# Patient Record
Sex: Male | Born: 1985 | Race: White | Hispanic: No | Smoking: Current every day smoker
Health system: Southern US, Community
[De-identification: ages and names within clinical notes are randomized; demographics above are authoritative.]

---

## 2019-10-17 ENCOUNTER — Encounter (HOSPITAL_COMMUNITY): Payer: Self-pay | Admitting: *Deleted

## 2019-10-17 ENCOUNTER — Emergency Department (HOSPITAL_COMMUNITY): Payer: Managed Care, Other (non HMO)

## 2019-10-17 ENCOUNTER — Emergency Department (HOSPITAL_COMMUNITY)
Admission: EM | Admit: 2019-10-17 | Discharge: 2019-10-17 | Disposition: A | Discharge status: Home / Self Care | Payer: Managed Care, Other (non HMO) | Attending: Emergency Medicine | Admitting: Emergency Medicine

## 2019-10-17 ENCOUNTER — Other Ambulatory Visit: Payer: Self-pay

## 2019-10-17 DIAGNOSIS — W228XXA Striking against or struck by other objects, initial encounter: Secondary | ICD-10-CM | POA: Insufficient documentation

## 2019-10-17 DIAGNOSIS — F17200 Nicotine dependence, unspecified, uncomplicated: Secondary | ICD-10-CM | POA: Diagnosis not present

## 2019-10-17 DIAGNOSIS — R110 Nausea: Secondary | ICD-10-CM | POA: Diagnosis not present

## 2019-10-17 DIAGNOSIS — Y9389 Activity, other specified: Secondary | ICD-10-CM | POA: Insufficient documentation

## 2019-10-17 DIAGNOSIS — R55 Syncope and collapse: Secondary | ICD-10-CM | POA: Diagnosis not present

## 2019-10-17 DIAGNOSIS — S0101XA Laceration without foreign body of scalp, initial encounter: Secondary | ICD-10-CM | POA: Diagnosis present

## 2019-10-17 DIAGNOSIS — Y92812 Truck as the place of occurrence of the external cause: Secondary | ICD-10-CM | POA: Diagnosis not present

## 2019-10-17 DIAGNOSIS — R519 Headache, unspecified: Secondary | ICD-10-CM | POA: Diagnosis not present

## 2019-10-17 DIAGNOSIS — Y999 Unspecified external cause status: Secondary | ICD-10-CM | POA: Insufficient documentation

## 2019-10-17 MED ORDER — LIDOCAINE-EPINEPHRINE-TETRACAINE (LET) TOPICAL GEL
3.0000 mL | Freq: Once | TOPICAL | Status: AC
  Administered 2019-10-17: 3 mL via TOPICAL
  Filled 2019-10-17: qty 3

## 2019-10-17 MED ORDER — ONDANSETRON 4 MG PO TBDP
4.0000 mg | ORAL_TABLET | Freq: Once | ORAL | Status: AC
  Administered 2019-10-17: 4 mg via ORAL
  Filled 2019-10-17: qty 1

## 2019-10-17 MED ORDER — ACETAMINOPHEN 325 MG PO TABS
650.0000 mg | ORAL_TABLET | Freq: Once | ORAL | Status: AC
  Administered 2019-10-17: 650 mg via ORAL
  Filled 2019-10-17: qty 2

## 2019-10-17 NOTE — ED Notes (Signed)
Pt transported to CT ?

## 2019-10-17 NOTE — Discharge Instructions (Addendum)
Your CT head and CT cervical spine was normal.  I have placed 2 staples to the back of your head, you will need to have these removed within 7-10 days. Please keep this area clean and dry.   If you experience any worsening symptoms, weakness please return to the ED.

## 2019-10-17 NOTE — ED Notes (Signed)
[phone] WIFE

## 2019-10-17 NOTE — ED Triage Notes (Signed)
The pt arrived by [facility] from home  The pt was working on his car stood ud up and struck the back of his head causing a laceration  Small cut no bleeding  He went to urgent care  And while standing in line he felt fain and fainted  On arrival here alert oriented skin warm and dry  C/o pain in the laceration site in the back of his head.  No nausea at present  A and o x4

## 2019-10-17 NOTE — ED Provider Notes (Signed)
[facility] EMERGENCY DEPARTMENT Provider Note   CSN: [id number] Arrival date & time: 10/17/19  1554     History Chief Complaint  Patient presents with  . Laceration    Douglas Mendoza is a 34 y.o. male.  34 y.o male with no PMH presents to the ED sent in from UC presents to the ED with a chief complaint of headache. Patient was working on his truck, reports striking his left back of his head. He reports he did not lose consciousness. While standing in line for UC check in, patient reports he felt nauseated and then proceeded to syncope. He reports falling from standing, losing consciousness. He reports waking up with everyone standing around him. Patient reports he feels like "a little run down" along with a headache most of the pain is locates on the right side of the back of his head. No vomiting episodes, no chest pain, no prior history of syncope.   The history is provided by the patient.  Laceration Associated symptoms: no fever        History reviewed. No pertinent past medical history.  There are no problems to display for this patient.   History reviewed. No pertinent surgical history.     No family history on file.  Social History   Tobacco Use  . Smoking status: Current Every Day Smoker  . Smokeless tobacco: Never Used  Substance Use Topics  . Alcohol use: Yes  . Drug use: Not on file    Home Medications Prior to Admission medications   Not on File    Allergies    Patient has no known allergies.  Review of Systems   Review of Systems  Constitutional: Negative for fever.  HENT: Negative for sore throat.   Eyes: Negative for photophobia and redness.  Respiratory: Negative for shortness of breath.   Cardiovascular: Negative for chest pain.  Gastrointestinal: Positive for nausea. Negative for abdominal pain and vomiting.  Genitourinary: Negative for flank pain.  Musculoskeletal: Negative for back pain.  Skin: Negative for pallor  and wound.  Neurological: Positive for light-headedness and headaches.    Physical Exam Updated Vital Signs BP 135/78 (BP Location: Left Arm)   Pulse 70   Temp 98.4 F (36.9 C)   Resp 16   Ht 5\' 9"  (1.753 m)   Wt 77.1 kg   SpO2 100%   BMI 25.10 kg/m   Physical Exam Vitals and nursing note reviewed.  Constitutional:      Appearance: He is well-developed.  HENT:     Head: Normocephalic and atraumatic.  Eyes:     General: No scleral icterus.    Pupils: Pupils are equal, round, and reactive to light.  Neck:     Comments: C collar in place.  Cardiovascular:     Heart sounds: Normal heart sounds.  Pulmonary:     Effort: Pulmonary effort is normal.     Breath sounds: Normal breath sounds. No wheezing.  Chest:     Chest wall: No tenderness.  Abdominal:     General: Bowel sounds are normal. There is no distension.     Palpations: Abdomen is soft.     Tenderness: There is no abdominal tenderness.  Musculoskeletal:        General: No tenderness or deformity.     Cervical back: Normal range of motion.  Skin:    General: Skin is warm and dry.  Neurological:     Mental Status: He is alert and oriented to  person, place, and time.     Comments: Alert, oriented, thought content appropriate. Speech fluent without evidence of aphasia. Able to follow 2 step commands without difficulty.  Cranial Nerves:  II:  Peripheral visual fields grossly normal, pupils, round, reactive to light III,IV, VI: ptosis not present, extra-ocular motions intact bilaterally  V,VII: smile symmetric, facial light touch sensation equal VIII: hearing grossly normal bilaterally  IX,X: midline uvula rise  XI: bilateral shoulder shrug equal and strong XII: midline tongue extension  Motor:  5/5 in upper and lower extremities bilaterally including strong and equal grip strength and dorsiflexion/plantar flexion Sensory: light touch normal in all extremities.  Cerebellar: normal finger-to-nose with bilateral  upper extremities, pronator drift negative Gait: normal gait and balance      ED Results / Procedures / Treatments   Labs (all labs ordered are listed, but only abnormal results are displayed) Labs Reviewed - No data to display  EKG None  Radiology CT Head Wo Contrast  Result Date: 10/17/2019 CLINICAL DATA:  Status post trauma. EXAM: CT HEAD WITHOUT CONTRAST TECHNIQUE: Contiguous axial images were obtained from the base of the skull through the vertex without intravenous contrast. COMPARISON:  None. FINDINGS: Brain: No evidence of acute infarction, hemorrhage, hydrocephalus, extra-axial collection or mass lesion/mass effect. Vascular: No hyperdense vessel or unexpected calcification. Skull: Normal. Negative for fracture or focal lesion. Sinuses/Orbits: No acute finding. Other: Mild right occipital scalp soft tissue swelling is seen. IMPRESSION: 1. No acute intracranial process. 2. Mild right occipital scalp soft tissue swelling. Electronically Signed   By: Aram Candela M.D.   On: 10/17/2019 16:52   CT Cervical Spine Wo Contrast  Result Date: 10/17/2019 CLINICAL DATA:  Status post trauma. EXAM: CT CERVICAL SPINE WITHOUT CONTRAST TECHNIQUE: Multidetector CT imaging of the cervical spine was performed without intravenous contrast. Multiplanar CT image reconstructions were also generated. COMPARISON:  None. FINDINGS: Alignment: Normal. Skull base and vertebrae: No acute fracture. No primary bone lesion or focal pathologic process. Soft tissues and spinal canal: No prevertebral fluid or swelling. No visible canal hematoma. Disc levels: C2-3: Normal endplates. Normal disc height and morphology. Normal bilateral uncovertebral and apophyseal joints. Normal central canal and intervertebral neuroforamina. C3-4: Normal endplates. Normal disc height and morphology. Normal bilateral uncovertebral and apophyseal joints. Normal central canal and intervertebral neuroforamina. C4-5: Normal endplates. Normal  disc height and morphology. Normal bilateral uncovertebral and apophyseal joints. Normal central canal and intervertebral neuroforamina. C5-6: Normal endplates. Normal disc height and morphology. Normal bilateral uncovertebral and apophyseal joints. Normal central canal and intervertebral neuroforamina. C6-7: Normal endplates. Normal disc height and morphology. Normal bilateral uncovertebral and apophyseal joints. Normal central canal and intervertebral neuroforamina. C7-T1: Normal endplates. Normal disc height and morphology. Normal bilateral uncovertebral and apophyseal joints. Normal central canal and intervertebral neuroforamina. Upper chest: Negative. Other: None. IMPRESSION: 1. Normal cervical spine CT. Electronically Signed   By: Aram Candela M.D.   On: 10/17/2019 16:54    Procedures .Marland KitchenLaceration Repair  Date/Time: 10/17/2019 5:55 PM Performed by: Claude Manges, PA-C Authorized by: Claude Manges, PA-C   Consent:    Consent obtained:  Verbal   Consent given by:  Patient   Risks discussed:  Infection, pain and poor cosmetic result   Alternatives discussed:  No treatment Anesthesia (see MAR for exact dosages):    Anesthesia method:  None Laceration details:    Location:  Scalp   Scalp location:  Crown   Length (cm):  1   Depth (mm):  0.5 Repair type:  Repair type:  Simple Exploration:    Hemostasis achieved with:  Direct pressure and LET Treatment:    Area cleansed with:  Saline   Amount of cleaning:  Extensive Skin repair:    Repair method:  Staples   Number of staples:  2 Approximation:    Approximation:  Close Post-procedure details:    Dressing:  Open (no dressing)   Patient tolerance of procedure:  Tolerated well, no immediate complications   (including critical care time)  Medications Ordered in ED Medications  acetaminophen (TYLENOL) tablet 650 mg (650 mg Oral Given 10/17/19 1633)  lidocaine-EPINEPHrine-tetracaine (LET) topical gel (3 mLs Topical Given 10/17/19  1704)  ondansetron (ZOFRAN-ODT) disintegrating tablet 4 mg (4 mg Oral Given 10/17/19 1703)    ED Course  I have reviewed the triage vital signs and the nursing notes.  Pertinent labs & imaging results that were available during my care of the patient were reviewed by me and considered in my medical decision making (see chart for details).    MDM Rules/Calculators/A&P   Patient with no PMH presents to the ED s/p syncopal episode while at [facility]. Patient was getting checked pain after bumping his head on his car, states he hit the back of his head and has a small 1.5 cm superficial laceration.  He reports pain to the area, while standing at urgent care he fell from standing striking the back of his head, he was placed in a c-collar and sent them to the ED for further evaluation.  During my evaluation patient is neurologically intact, he has been ambulatory in the ED with a steady gait.  He does report feeling nauseous, reports he feels he is somewhat in a fog.  Vitals are within normal limits, he is able to answer questions, is alert and oriented x4.  Will obtain CT imaging as patient does endorse some nausea along with loss of consciousness.  CT head showed no acute infarct, hemorrhage, pathology. CT cervical spine without any fractures, acute process.  She was given Tylenol 650 mg prior to CT imaging.  Patient is endorsing some nausea therefore given some Zofran.  We have applied let on to patient's head, this would be to help with anesthetic prior to repair.  I have personally placed 2 staples to the back of patient's head.  He was able to tolerate the procedure well.  Patient discharged in stable condition.   Portions of this note were generated with [employer]. Dictation errors may occur despite best attempts at proofreading.  Final Clinical Impression(s) / ED Diagnoses Final diagnoses:  Laceration of scalp without foreign body, initial encounter  Syncope, unspecified  syncope type    Rx / DC Orders ED Discharge Orders    None       Janeece Fitting, Hershal Coria 10/17/19 1756    Noemi Chapel, MD 10/18/19 [phone]

## 2019-10-17 NOTE — ED Notes (Signed)
Pt d/c home per MD order , Discharge summary reviewed, pt verbalizes understanding. Reports wife is discharge ride home. Ambulatory off unit. No s/s of acute distress noted.

## 2019-10-24 ENCOUNTER — Other Ambulatory Visit: Payer: Self-pay

## 2019-10-24 ENCOUNTER — Emergency Department (HOSPITAL_COMMUNITY)
Admission: EM | Admit: 2019-10-24 | Discharge: 2019-10-24 | Disposition: A | Discharge status: Home / Self Care | Payer: Managed Care, Other (non HMO) | Attending: Emergency Medicine | Admitting: Emergency Medicine

## 2019-10-24 DIAGNOSIS — X58XXXD Exposure to other specified factors, subsequent encounter: Secondary | ICD-10-CM | POA: Insufficient documentation

## 2019-10-24 DIAGNOSIS — F17200 Nicotine dependence, unspecified, uncomplicated: Secondary | ICD-10-CM | POA: Insufficient documentation

## 2019-10-24 DIAGNOSIS — S0101XD Laceration without foreign body of scalp, subsequent encounter: Secondary | ICD-10-CM | POA: Diagnosis not present

## 2019-10-24 DIAGNOSIS — Z4802 Encounter for removal of sutures: Secondary | ICD-10-CM | POA: Diagnosis present

## 2019-10-24 NOTE — Discharge Instructions (Signed)
Return to the ER with any new, worsening, or concerning symptoms.  

## 2019-10-24 NOTE — ED Triage Notes (Signed)
Pt reports he is hear for staple removal from scalp, staples placed on 1/30, pt denies any wound issues.

## 2019-10-24 NOTE — ED Notes (Signed)
Patient verbalizes understanding of discharge instructions . Opportunity for questions and answers were provided . Armband removed by staff ,Pt discharged from ED. W/C  offered at D/C  and Declined W/C at D/C and was escorted to lobby by RN.  

## 2019-10-24 NOTE — ED Provider Notes (Signed)
[place] [facility] EMERGENCY DEPARTMENT Provider Note   CSN: [id number] Arrival date & time: 10/24/19  1444     History Chief Complaint  Patient presents with  . Suture / Staple Removal    Douglas Mendoza is a 34 y.o. male presenting for staple removal.  Patient states 1 week ago he had 2 staples placed in the back of his head.  Reports no issues; no fevers, chills, tenderness, or purulent drainage.   HPI     No past medical history on file.  There are no problems to display for this patient.   No past surgical history on file.     No family history on file.  Social History   Tobacco Use  . Smoking status: Current Every Day Smoker  . Smokeless tobacco: Never Used  Substance Use Topics  . Alcohol use: Yes  . Drug use: Not on file    Home Medications Prior to Admission medications   Not on File    Allergies    Patient has no known allergies.  Review of Systems   Review of Systems  Skin: Positive for wound.    Physical Exam Updated Vital Signs BP 130/61   Pulse 68   Temp 98.4 F (36.9 C) (Oral)   Resp 12   SpO2 97%   Physical Exam Vitals and nursing note reviewed.  Constitutional:      General: He is not in acute distress.    Appearance: He is well-developed.  HENT:     Head: Normocephalic.      Comments: Well-healing laceration to the occiput.  Staples present.  No tenderness, fluctuance, induration, or purulent drainage. Pulmonary:     Effort: Pulmonary effort is normal.  Abdominal:     General: There is no distension.  Musculoskeletal:        General: Normal range of motion.     Cervical back: Normal range of motion.  Skin:    General: Skin is warm.     Findings: No rash.  Neurological:     Mental Status: He is alert and oriented to person, place, and time.     ED Results / Procedures / Treatments   Labs (all labs ordered are listed, but only abnormal results are displayed) Labs Reviewed - No data to  display  EKG None  Radiology No results found.  Procedures .Suture Removal  Date/Time: 10/24/2019 3:49 PM Performed by: Franchot Heidelberg, PA-C Authorized by: Franchot Heidelberg, PA-C   Consent:    Consent obtained:  Verbal   Consent given by:  Patient   Risks discussed:  Pain, wound separation and bleeding Location:    Location:  Head/neck   Head/neck location:  Scalp Procedure details:    Wound appearance:  No signs of infection, good wound healing, clean and nontender   Number of staples removed:  2 Post-procedure details:    Post-removal:  No dressing applied   Patient tolerance of procedure:  Tolerated well, no immediate complications   (including critical care time)  Medications Ordered in ED Medications - No data to display  ED Course  I have reviewed the triage vital signs and the nursing notes.  Pertinent labs & imaging results that were available during my care of the patient were reviewed by me and considered in my medical decision making (see chart for details).    MDM Rules/Calculators/A&P                      Patient  presenting for staple removal.  Physical exam reassuring, no signs of infection.  Staples removed as described above.  Aftercare instructions given.  At this time, patient appears safe for discharge.  Return precautions given.  Patient states he understands and agrees to plan.  Final Clinical Impression(s) / ED Diagnoses Final diagnoses:  Encounter for staple removal    Rx / DC Orders ED Discharge Orders    None       Alveria Apley, PA-C 10/24/19 1550    Derwood Kaplan, MD 10/24/19 1551

## 2020-11-11 IMAGING — CT CT CERVICAL SPINE W/O CM
3 of 5 series · 11 of 33 positions shown, 13 images · non-contrast
Comparison: None.

CLINICAL DATA: Status post trauma.

EXAM:
CT CERVICAL SPINE WITHOUT CONTRAST
TECHNIQUE: Multidetector CT imaging of the cervical spine was performed without
intravenous contrast. Multiplanar CT image reconstructions were also
generated.

[Series 6: c_spine 2.0 sag bone · sagittal · 0.22mm/px · 5 of 61 slices shown, 6 images]
[im 21/61  bone]
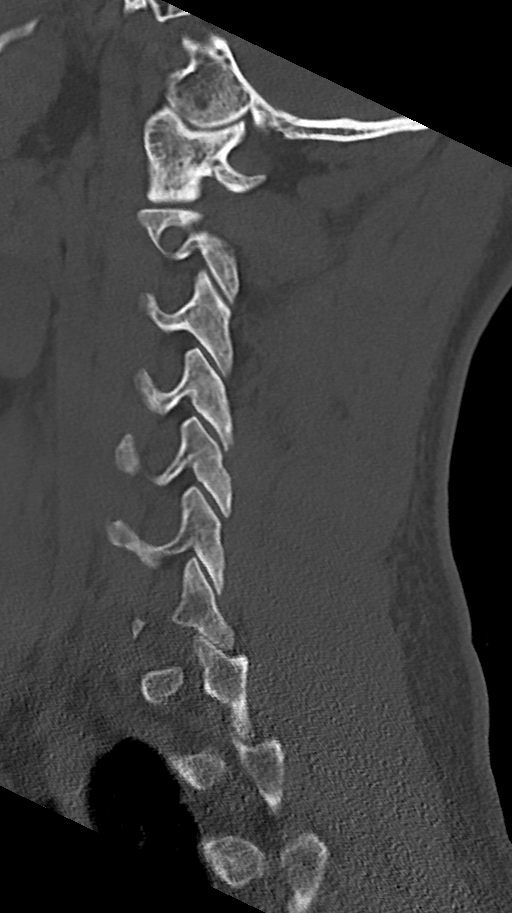
[im 26/61  bone]
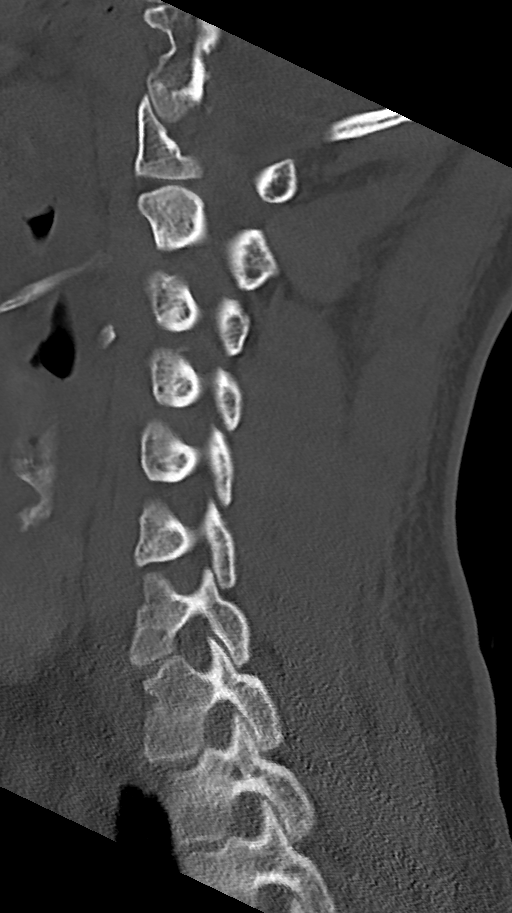
[im 31/61  soft-tissue]
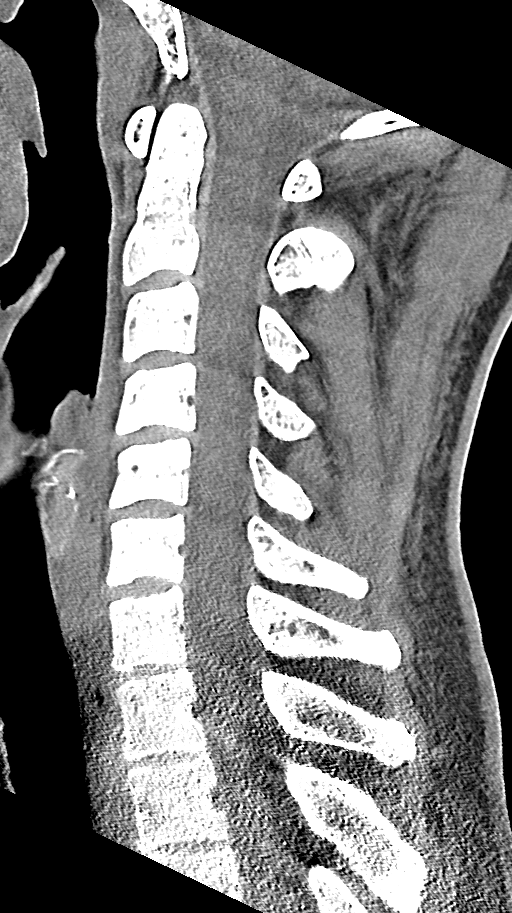
[im 31/61  bone]
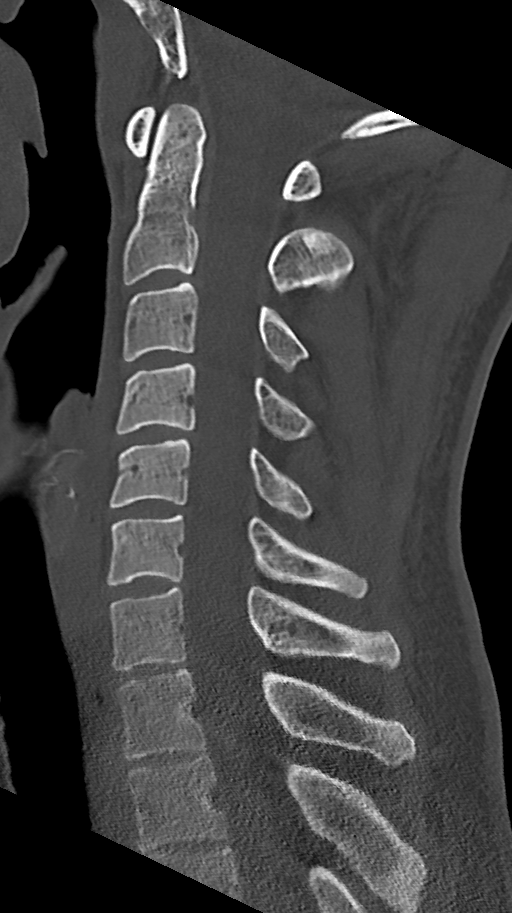
[im 36/61  bone]
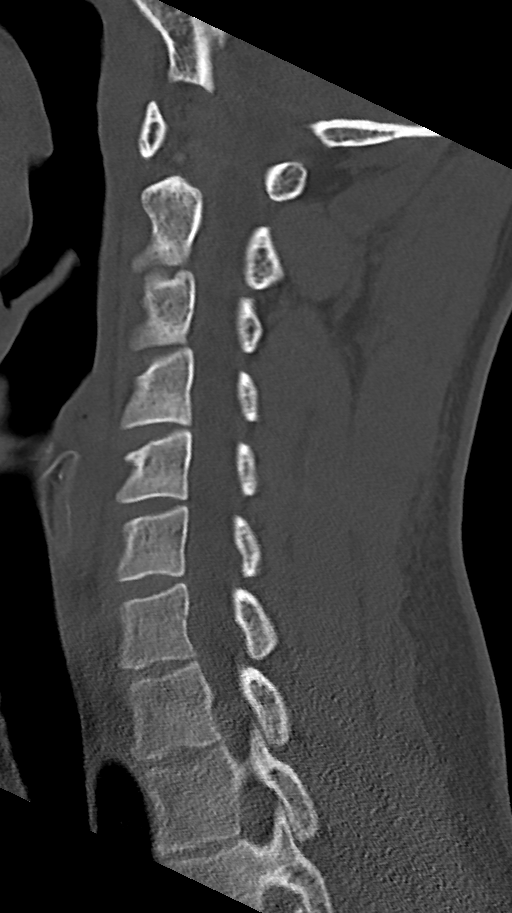
[im 41/61  bone]
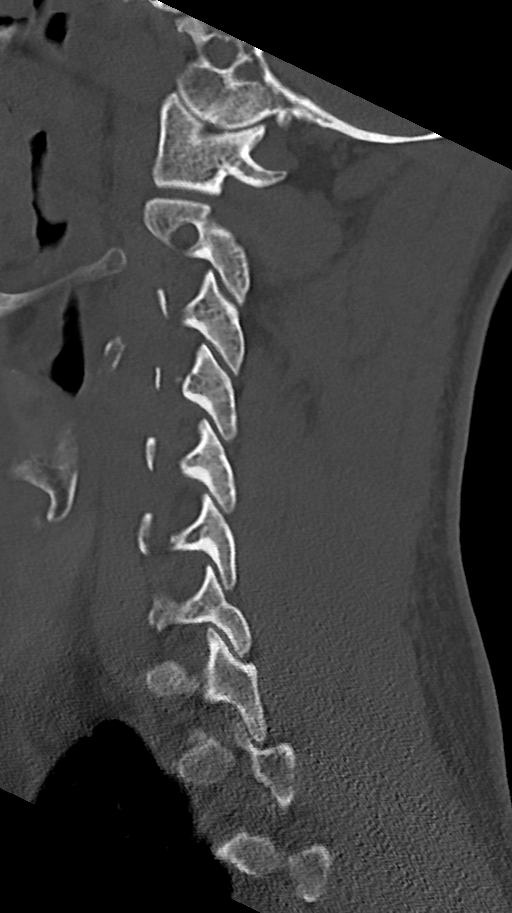

[Series 7: c_spine 2.0 cor bone · coronal · 0.25mm/px · 3 of 61 slices shown]
[im 13/61  bone]
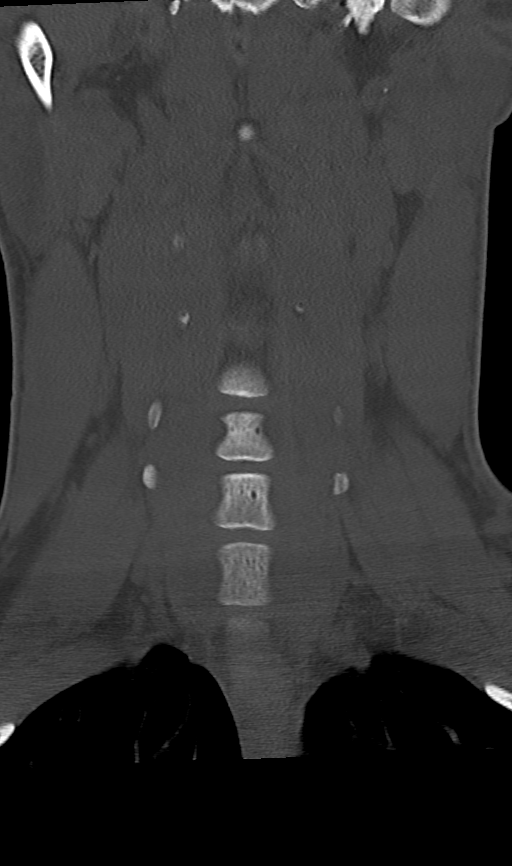
[im 25/61  bone]
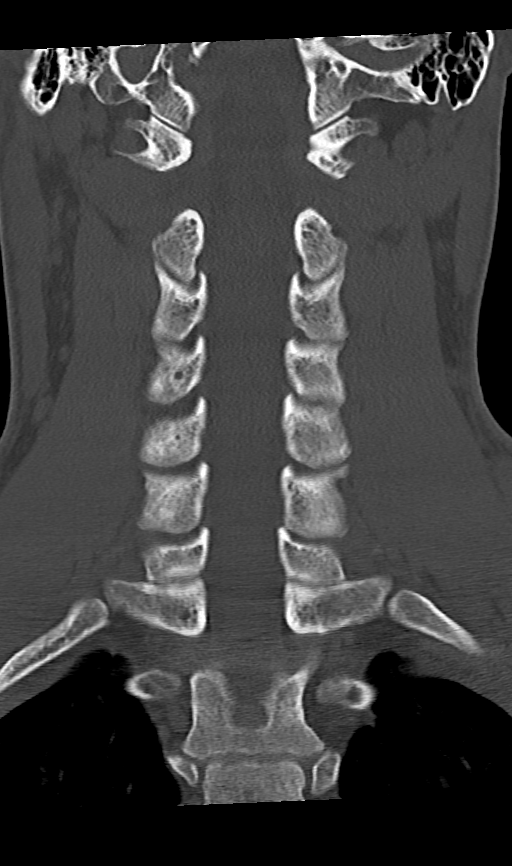
[im 36/61  bone]
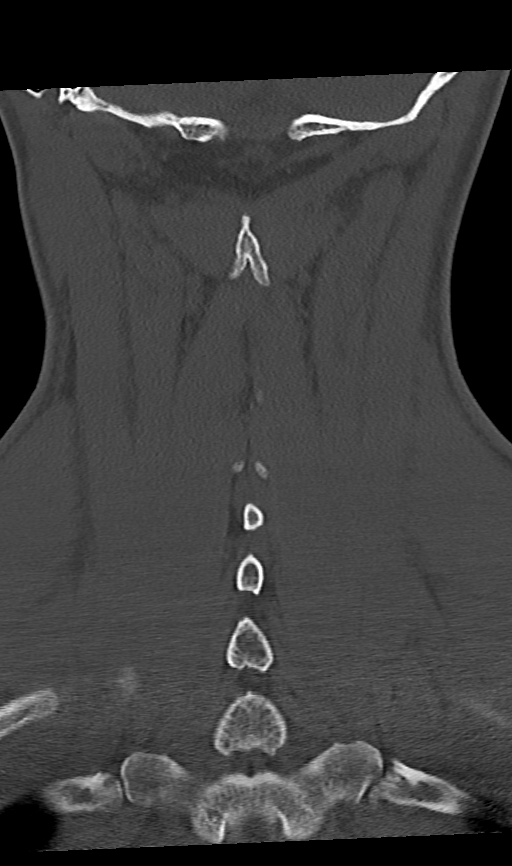

[Series 9: c_spine 1.0 st thins · axial · 0.36mm/px · z∈[-246,-148]mm · 3 of 248 slices shown, 4 images]
[im 71/248  soft-tissue]
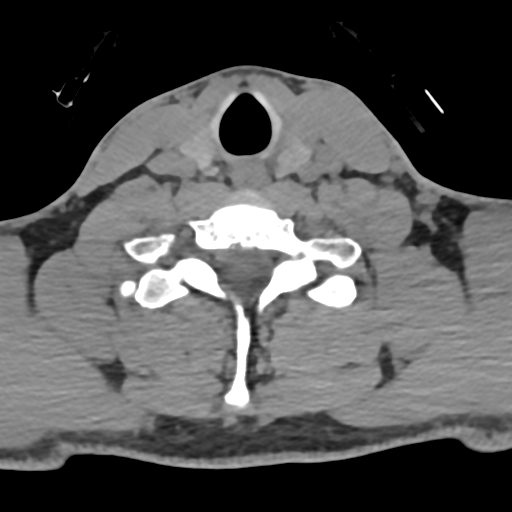
[im 71/248  bone]
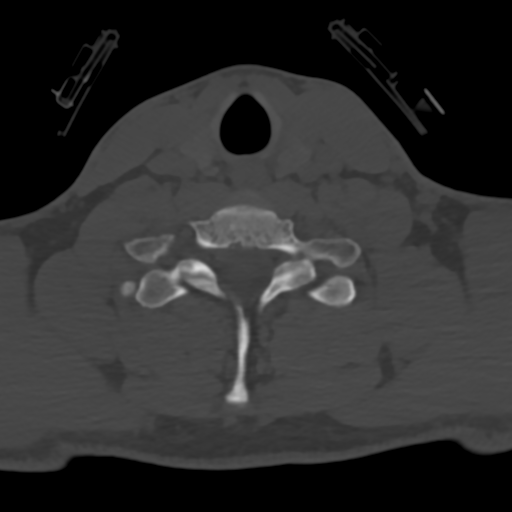
[im 142/248  bone]
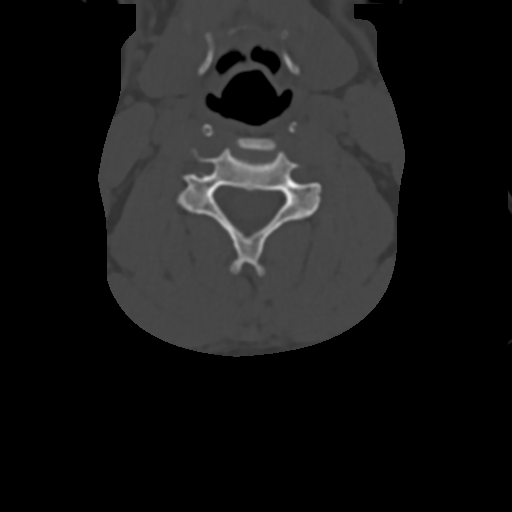
[im 212/248  bone]
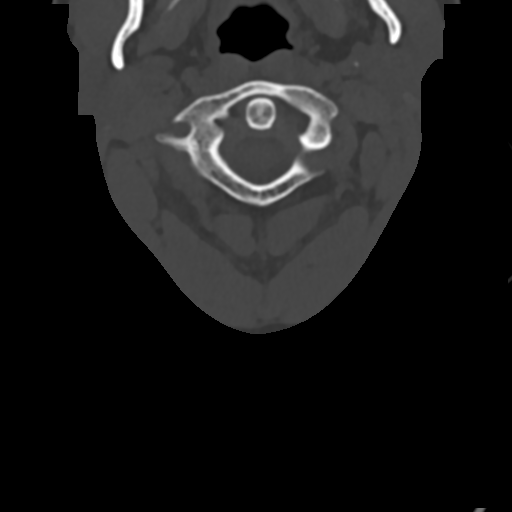

[11 of 33 positions shown; findings below may reference images not displayed]

FINDINGS: Alignment: Normal.

Skull base and vertebrae: No acute fracture. No primary bone lesion
or focal pathologic process.

Soft tissues and spinal canal: No prevertebral fluid or swelling. No
visible canal hematoma.

Disc levels:

C2-3: Normal endplates. Normal disc height and morphology. Normal
bilateral uncovertebral and apophyseal joints. Normal central canal
and intervertebral neuroforamina.
C3-4: Normal endplates. Normal disc height and morphology. Normal
bilateral uncovertebral and apophyseal joints. Normal central canal
and intervertebral neuroforamina.
C4-5: Normal endplates. Normal disc height and morphology. Normal
bilateral uncovertebral and apophyseal joints. Normal central canal
and intervertebral neuroforamina.
C5-6: Normal endplates. Normal disc height and morphology. Normal
bilateral uncovertebral and apophyseal joints. Normal central canal
and intervertebral neuroforamina.
C6-7: Normal endplates. Normal disc height and morphology. Normal
bilateral uncovertebral and apophyseal joints. Normal central canal
and intervertebral neuroforamina.
C7-T1: Normal endplates. Normal disc height and morphology. Normal
bilateral uncovertebral and apophyseal joints. Normal central canal
and intervertebral neuroforamina.

Upper chest: Negative.

Other: None.
IMPRESSION: 1. Normal cervical spine CT.

## 2020-11-11 IMAGING — CT CT HEAD W/O CM
4 series · 17 of 47 positions shown, 19 images · non-contrast
Comparison: None.

CLINICAL DATA: Status post trauma.

EXAM:
CT HEAD WITHOUT CONTRAST
TECHNIQUE: Contiguous axial images were obtained from the base of the skull
through the vertex without intravenous contrast.

[Series 3: head without · axial · non-contrast · 0.45mm/px · z∈[-107,+13]mm · 7 of 32 slices shown, 9 images]
[im 4/32  brain]
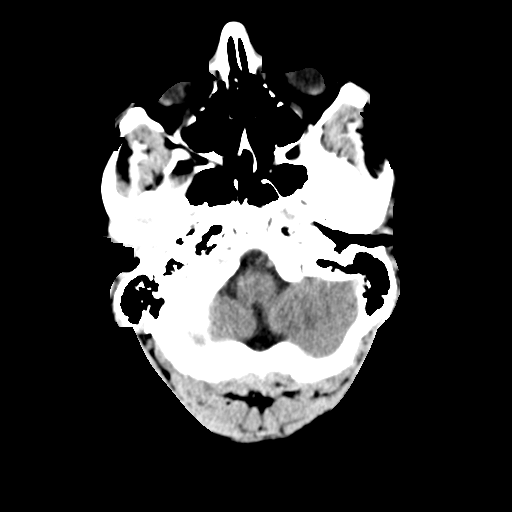
[im 4/32  bone]
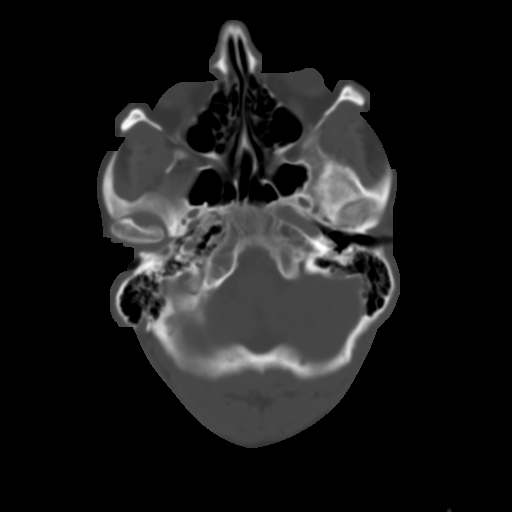
[im 8/32  brain]
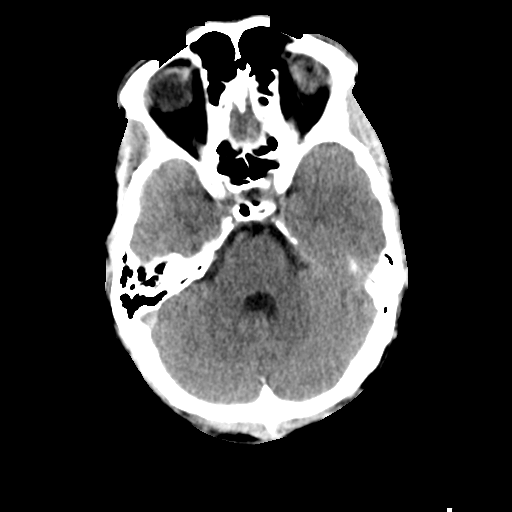
[im 12/32  brain]
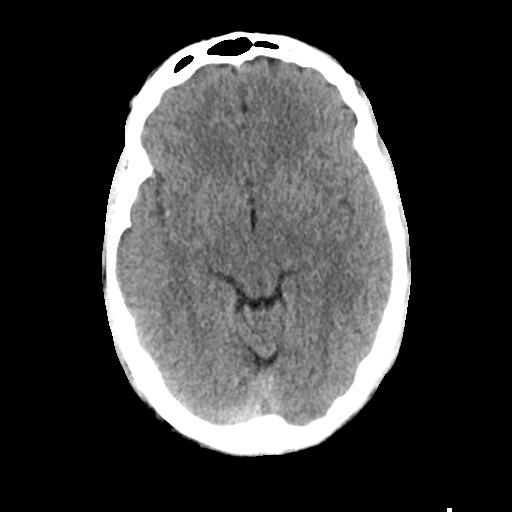
[im 16/32  brain]
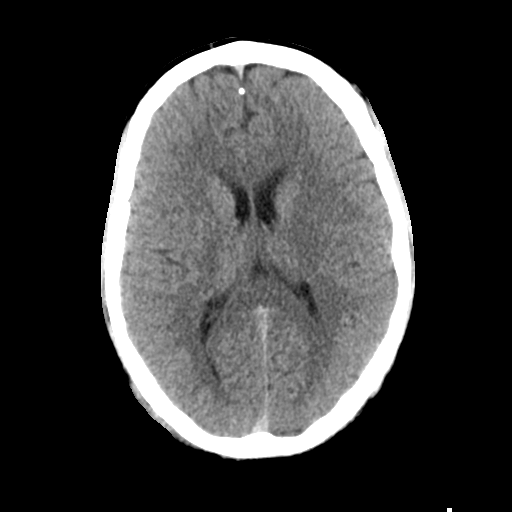
[im 20/32  brain]
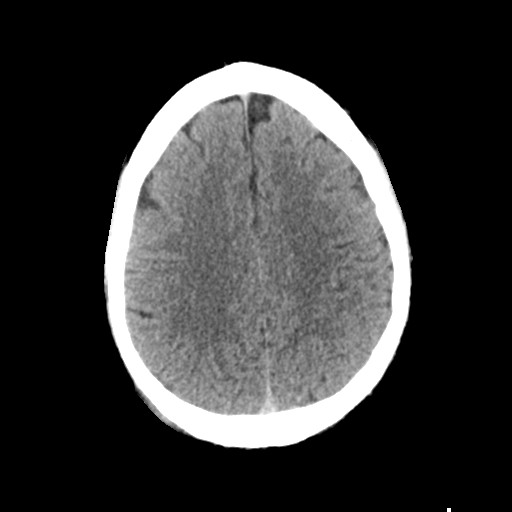
[im 20/32  bone]
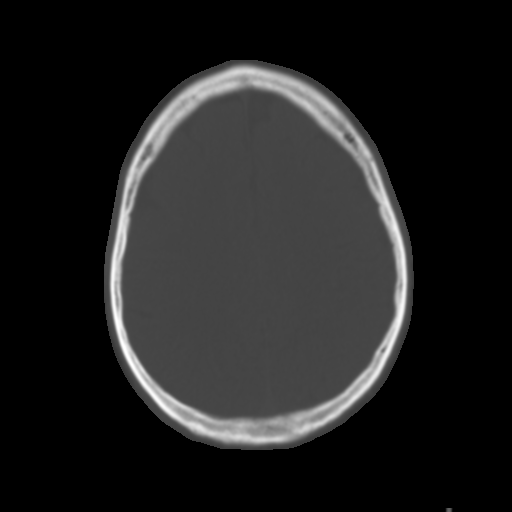
[im 24/32  brain]
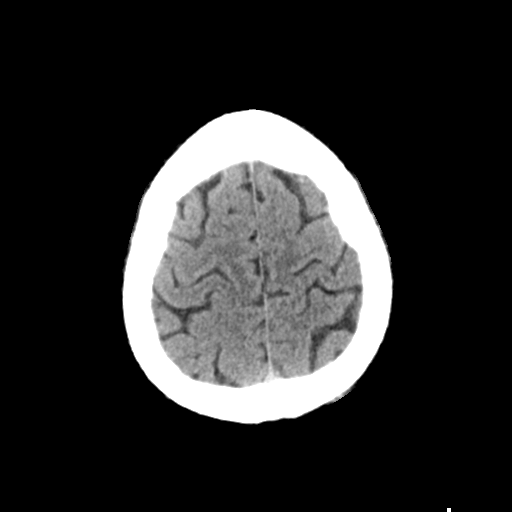
[im 28/32  brain]
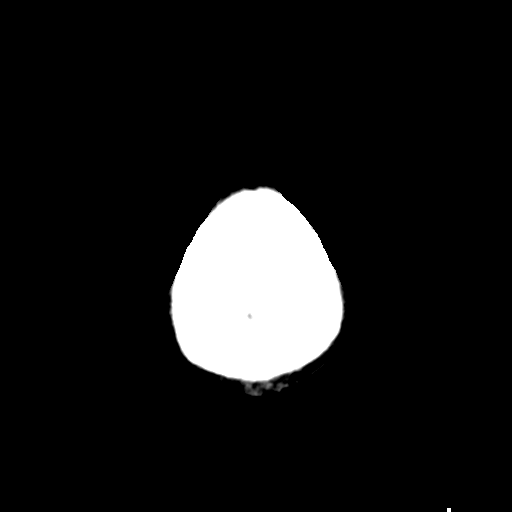

[Series 4: head bone · axial · 0.45mm/px · z∈[-108,-52]mm · 4 of 79 slices shown]
[im 8/79  bone]
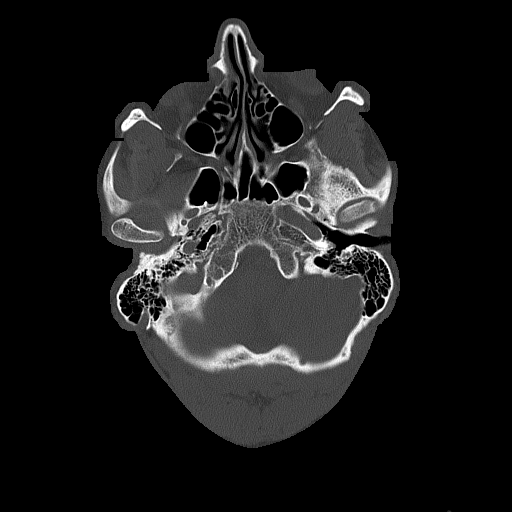
[im 16/79  bone]
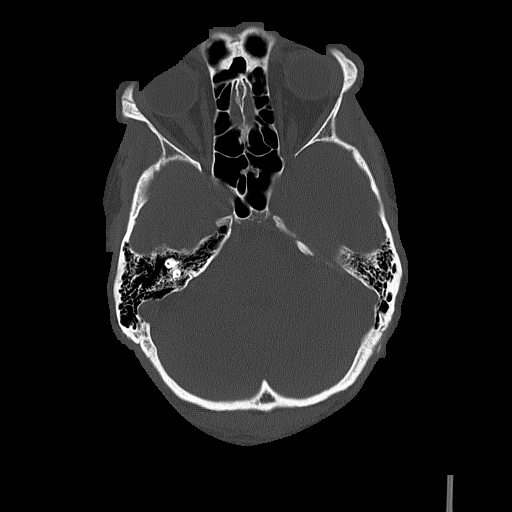
[im 24/79  bone]
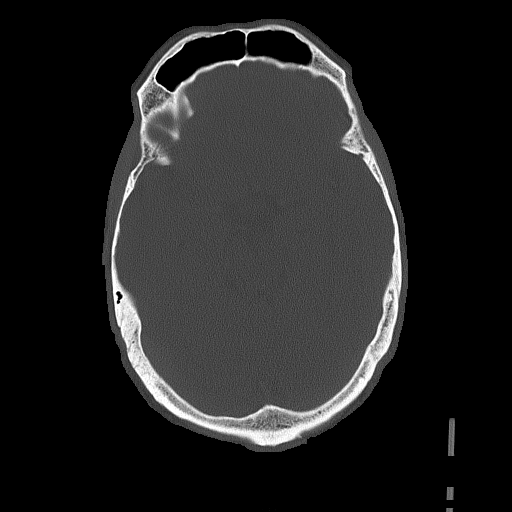
[im 36/79  bone]
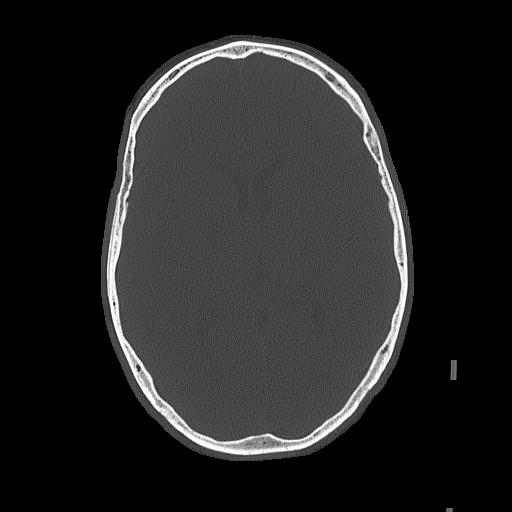

[Series 5: head without cor · coronal · non-contrast · 0.31mm/px · 3 of 69 slices shown]
[im 23/69  brain]
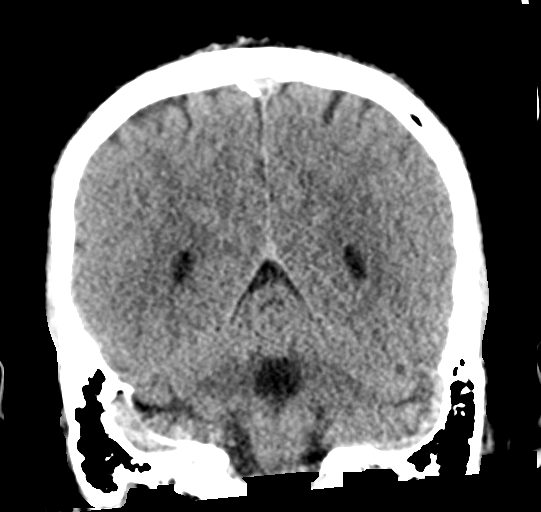
[im 31/69  brain]
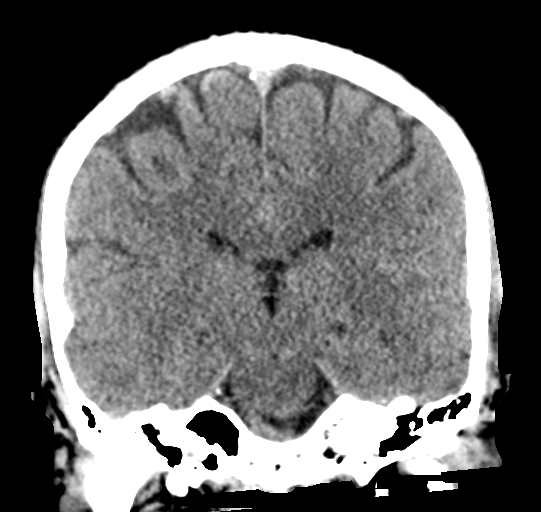
[im 38/69  brain]
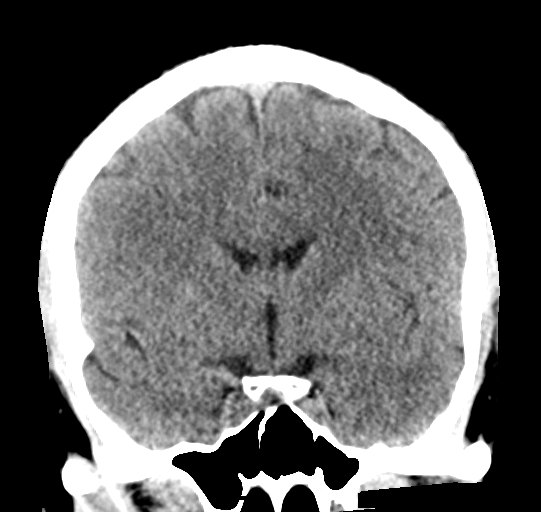

[Series 6: head without sag · sagittal · non-contrast · 0.31mm/px · 3 of 58 slices shown]
[im 20/58  brain]
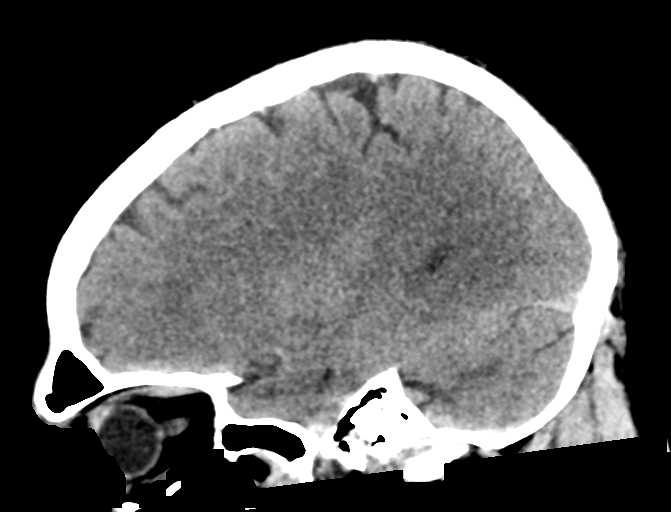
[im 29/58  brain]
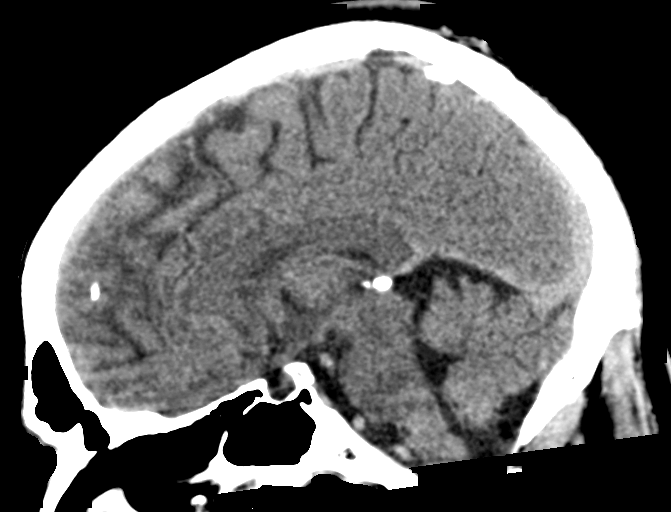
[im 39/58  brain]
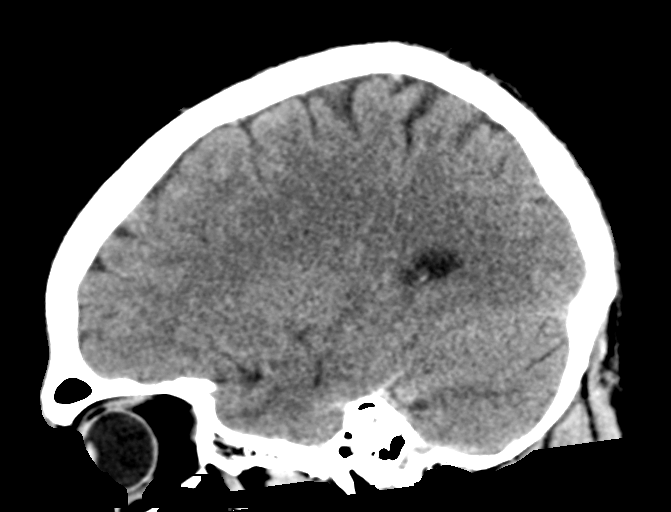

[17 of 47 positions shown; findings below may reference images not displayed]

FINDINGS: Brain: No evidence of acute infarction, hemorrhage, hydrocephalus,
extra-axial collection or mass lesion/mass effect.

Vascular: No hyperdense vessel or unexpected calcification.

Skull: Normal. Negative for fracture or focal lesion.

Sinuses/Orbits: No acute finding.

Other: Mild right occipital scalp soft tissue swelling is seen.
IMPRESSION: 1. No acute intracranial process.
2. Mild right occipital scalp soft tissue swelling.
